# Patient Record
Sex: Female | Born: 1995 | Race: White | Hispanic: No | Smoking: Never smoker
Health system: Southern US, Community
[De-identification: ages and names within clinical notes are randomized; demographics above are authoritative.]

## PROBLEM LIST (undated history)

## (undated) DIAGNOSIS — Z789 Other specified health status: Secondary | ICD-10-CM

## (undated) HISTORY — PX: TONSILLECTOMY: SUR1361

---

## 2020-06-16 ENCOUNTER — Inpatient Hospital Stay (HOSPITAL_COMMUNITY): Payer: BC Managed Care – PPO

## 2020-06-16 ENCOUNTER — Encounter (HOSPITAL_COMMUNITY): Payer: Self-pay | Admitting: Obstetrics and Gynecology

## 2020-06-16 ENCOUNTER — Inpatient Hospital Stay (HOSPITAL_COMMUNITY)
Admission: AD | Admit: 2020-06-16 | Discharge: 2020-06-16 | Disposition: A | Discharge status: Home / Self Care | Payer: BC Managed Care – PPO | Attending: Obstetrics and Gynecology | Admitting: Obstetrics and Gynecology

## 2020-06-16 DIAGNOSIS — Z3A14 14 weeks gestation of pregnancy: Secondary | ICD-10-CM | POA: Diagnosis not present

## 2020-06-16 DIAGNOSIS — O208 Other hemorrhage in early pregnancy: Secondary | ICD-10-CM | POA: Insufficient documentation

## 2020-06-16 DIAGNOSIS — O468X2 Other antepartum hemorrhage, second trimester: Secondary | ICD-10-CM | POA: Diagnosis not present

## 2020-06-16 DIAGNOSIS — O418X20 Other specified disorders of amniotic fluid and membranes, second trimester, not applicable or unspecified: Secondary | ICD-10-CM

## 2020-06-16 DIAGNOSIS — O4690 Antepartum hemorrhage, unspecified, unspecified trimester: Secondary | ICD-10-CM

## 2020-06-16 HISTORY — DX: Other specified health status: Z78.9

## 2020-06-16 LAB — WET PREP, GENITAL
Clue Cells Wet Prep HPF POC: NONE SEEN
Sperm: NONE SEEN
Trich, Wet Prep: NONE SEEN
Yeast Wet Prep HPF POC: NONE SEEN

## 2020-06-16 LAB — URINALYSIS, ROUTINE W REFLEX MICROSCOPIC
Bilirubin Urine: NEGATIVE
Glucose, UA: NEGATIVE mg/dL
Ketones, ur: NEGATIVE mg/dL
Nitrite: NEGATIVE
Protein, ur: 30 mg/dL — AB
RBC / HPF: >50 RBC/hpf — ABNORMAL HIGH (ref 0–5)
Specific Gravity, Urine: 1.012 (ref 1.005–1.030)
pH: 5 (ref 5.0–8.0)

## 2020-06-16 LAB — POCT PREGNANCY, URINE: Preg Test, Ur: POSITIVE — AB

## 2020-06-16 NOTE — MAU Provider Note (Addendum)
History     CSN: [id number]  Arrival date and time: 06/16/20 2024   Event Date/Time   First Provider Initiated Contact with Patient 06/16/20 2100      Chief Complaint  Patient presents with  . Vaginal Bleeding   Cathy Kemp is a 25 y.o. G1P0 at [redacted]w[redacted]d by LMP who presents to MAU with complaints of vaginal bleeding. Patient reports that she has been seen in the office multiple times for light vaginal spotting and was diagnosed with [facility]. Patient reports that about an hour ago was sitting on the couch and started having heavy vaginal bleeding that was bright red. She reports having a gush of blood went to the bathroom and started passing large clots the size of a kiwi. Patient reports that in association with the vaginal bleeding she also started having some lower abdominal cramping, rates cramping 2/10.    OB History    Gravida  1   Para      Term      Preterm      AB      Living        SAB      IAB      Ectopic      Multiple      Live Births              Past Medical History:  Diagnosis Date  . Medical history non-contributory     Past Surgical History:  Procedure Laterality Date  . TONSILLECTOMY      Family History  Problem Relation Age of Onset  . Hypertension Mother   . Hyperthyroidism Maternal Aunt   . Hypothyroidism Maternal Grandmother     Social History   Tobacco Use  . Smoking status: Never Smoker  . Smokeless tobacco: Never Used  Vaping Use  . Vaping Use: Never used  Substance Use Topics  . Alcohol use: Never  . Drug use: Never    Allergies: No Known Allergies  Medications Prior to Admission  Medication Sig Dispense Refill Last Dose  . loratadine (CLARITIN) 10 MG tablet Take 10 mg by mouth daily.   06/16/2020 at Unknown time  . Prenatal Vit-Fe Fumarate-FA (PRENATAL MULTIVITAMIN) TABS tablet Take 1 tablet by mouth daily at 12 noon.   06/16/2020 at Unknown time    Review of Systems  Constitutional: Negative.   Respiratory:  Negative.   Cardiovascular: Negative.   Gastrointestinal: Positive for abdominal pain. Negative for constipation, diarrhea, nausea and vomiting.  Genitourinary: Positive for vaginal bleeding. Negative for difficulty urinating, dysuria, frequency, pelvic pain and urgency.  Musculoskeletal: Negative.   Neurological: Negative.   Psychiatric/Behavioral: Negative.    Physical Exam   Blood pressure 131/82, pulse (!) 104, temperature 98.7 F (37.1 C), resp. rate 16, height 5\' 3"  (1.6 m), weight 84.4 kg.  Physical Exam HENT:     Head: Normocephalic.  Cardiovascular:     Rate and Rhythm: Normal rate and regular rhythm.  Pulmonary:     Effort: Pulmonary effort is normal. No respiratory distress.     Breath sounds: Normal breath sounds. No wheezing.  Abdominal:     General: There is no distension.     Palpations: Abdomen is soft.     Tenderness: There is abdominal tenderness. There is no right CVA tenderness, left CVA tenderness, guarding or rebound.     Comments: LLQ tenderness- patient reports having a left ovarian cyst  Genitourinary:    Exam position: Lithotomy position.     Vagina: Bleeding  present.     Comments: Pelvic exam: Cervix pink, visually closed, without lesion, small amount of bright red mucous vaginal bleeding, no clots present, vaginal walls and external genitalia normal Musculoskeletal:     Right lower leg: No edema.     Left lower leg: No edema.  Skin:    General: Skin is warm and dry.  Neurological:     Mental Status: She is alert and oriented to person, place, and time.  Psychiatric:        Mood and Affect: Mood normal.        Behavior: Behavior normal.        Thought Content: Thought content normal.    FHR 165 by doppler   MAU Course  Procedures  MDM Orders Placed This Encounter  Procedures  . Wet prep, genital  . US OB LESS THAN 14 WEEKS WITH OB TRANSVAGINAL  . Urinalysis, Routine w reflex microscopic Urine, Clean Catch  . Pregnancy, urine POC    Labs and [place] report reviewed:  Results for orders placed or performed during the hospital encounter of 06/16/20 (from the past 24 hour(s))  Pregnancy, urine POC     Status: Abnormal   Collection Time: 06/16/20  8:42 PM  Result Value Ref Range   Preg Test, Ur POSITIVE (A) NEGATIVE  Urinalysis, Routine w reflex microscopic Urine, Clean Catch     Status: Abnormal   Collection Time: 06/16/20  8:43 PM  Result Value Ref Range   Color, Urine YELLOW YELLOW   APPearance HAZY (A) CLEAR   Specific Gravity, Urine 1.012 1.005 - 1.030   pH 5.0 5.0 - 8.0   Glucose, UA NEGATIVE NEGATIVE mg/dL   Hgb urine dipstick LARGE (A) NEGATIVE   Bilirubin Urine NEGATIVE NEGATIVE   Ketones, ur NEGATIVE NEGATIVE mg/dL   Protein, ur 30 (A) NEGATIVE mg/dL   Nitrite NEGATIVE NEGATIVE   Leukocytes,Ua LARGE (A) NEGATIVE   RBC / HPF >50 (H) 0 - 5 RBC/hpf   WBC, UA 21-50 0 - 5 WBC/hpf   Bacteria, UA FEW (A) NONE SEEN   Squamous Epithelial / LPF 0-5 0 - 5   Mucus PRESENT   Wet prep, genital     Status: Abnormal   Collection Time: 06/16/20  9:13 PM   Specimen: Vaginal  Result Value Ref Range   Yeast Wet Prep HPF POC NONE SEEN NONE SEEN   Trich, Wet Prep NONE SEEN NONE SEEN   Clue Cells Wet Prep HPF POC NONE SEEN NONE SEEN   WBC, Wet Prep HPF POC MANY (A) NONE SEEN   Sperm NONE SEEN    US showed continued [facility] measuring 2.4 x 1.1 x 0.7cm  Educated and discussed [facility] precautions and what to expect with vaginal bleeding. Pelvic rest and follow up as scheduled in the office for prenatal care.   Discussed reasons to return to MAU. Return to MAU as needed. Pt stable at time of discharge.   Assessment and Plan   1. Subchorionic hematoma in second trimester, single or unspecified fetus   2. Vaginal bleeding during pregnancy   3. [redacted] weeks gestation of pregnancy    Discharge home Follow up as scheduled in the office for prenatal care Return to MAU as needed for reasons discussed and/or emergencies  [facility] precautions  and bleeding precautions    Follow-up Information    [facility] Follow up.   Contact information: [address] [place] [address] [phone]  Allergies as of 06/16/2020   No Known Allergies     Medication List    TAKE these medications   loratadine 10 MG tablet Commonly known as: CLARITIN Take 10 mg by mouth daily.   prenatal multivitamin Tabs tablet Take 1 tablet by mouth daily at 12 noon.       Sharyon Cable CNM 06/16/2020, 10:07 PM

## 2020-06-16 NOTE — MAU Note (Addendum)
Have seen my ob few times due to some vaginal bleeding. Was seen Friday at office and everythink ok. Went to [employer] and had a lot of vag bleeding and clots. Some abdominal cramping since bleeding started. Does have subchorionic hemorrhage

## 2020-06-16 NOTE — Progress Notes (Signed)
Steward Drone CNM in earlier to discuss test results and discharge plan. Written and verbal d/c instructions given and understanding voiced.

## 2020-06-17 LAB — GC/CHLAMYDIA PROBE AMP (~~LOC~~) NOT AT ARMC
Chlamydia: NEGATIVE
Comment: NEGATIVE
Comment: NORMAL
Neisseria Gonorrhea: NEGATIVE

## 2020-06-22 ENCOUNTER — Inpatient Hospital Stay (HOSPITAL_BASED_OUTPATIENT_CLINIC_OR_DEPARTMENT_OTHER): Payer: BC Managed Care – PPO

## 2020-06-22 ENCOUNTER — Observation Stay (HOSPITAL_COMMUNITY)
Admission: AD | Admit: 2020-06-22 | Discharge: 2020-06-23 | Disposition: A | Discharge status: Home / Self Care | Payer: BC Managed Care – PPO | Attending: Obstetrics and Gynecology | Admitting: Obstetrics and Gynecology

## 2020-06-22 DIAGNOSIS — Z3A15 15 weeks gestation of pregnancy: Secondary | ICD-10-CM | POA: Diagnosis not present

## 2020-06-22 DIAGNOSIS — O4690 Antepartum hemorrhage, unspecified, unspecified trimester: Secondary | ICD-10-CM

## 2020-06-22 DIAGNOSIS — O200 Threatened abortion: Secondary | ICD-10-CM | POA: Diagnosis not present

## 2020-06-22 DIAGNOSIS — O468X2 Other antepartum hemorrhage, second trimester: Secondary | ICD-10-CM

## 2020-06-22 DIAGNOSIS — O26892 Other specified pregnancy related conditions, second trimester: Secondary | ICD-10-CM | POA: Diagnosis not present

## 2020-06-22 DIAGNOSIS — O209 Hemorrhage in early pregnancy, unspecified: Secondary | ICD-10-CM | POA: Diagnosis present

## 2020-06-22 DIAGNOSIS — O039 Complete or unspecified spontaneous abortion without complication: Secondary | ICD-10-CM | POA: Diagnosis not present

## 2020-06-22 LAB — COMPREHENSIVE METABOLIC PANEL
ALT: 42 U/L (ref 0–44)
AST: 23 U/L (ref 15–41)
Albumin: 3.4 g/dL — ABNORMAL LOW (ref 3.5–5.0)
Alkaline Phosphatase: 84 U/L (ref 38–126)
Anion gap: 13 (ref 5–15)
BUN: 5 mg/dL — ABNORMAL LOW (ref 6–20)
CO2: 20 mmol/L — ABNORMAL LOW (ref 22–32)
Calcium: 9 mg/dL (ref 8.9–10.3)
Chloride: 103 mmol/L (ref 98–111)
Creatinine, Ser: 0.57 mg/dL (ref 0.44–1.00)
GFR, Estimated: >60 mL/min (ref >60)
Glucose, Bld: 112 mg/dL — ABNORMAL HIGH (ref 70–99)
Potassium: 3.1 mmol/L — ABNORMAL LOW (ref 3.5–5.1)
Sodium: 136 mmol/L (ref 135–145)
Total Bilirubin: 0.4 mg/dL (ref 0.3–1.2)
Total Protein: 7 g/dL (ref 6.5–8.1)

## 2020-06-22 LAB — CBC WITH DIFFERENTIAL/PLATELET
Abs Immature Granulocytes: 0.1 10*3/uL — ABNORMAL HIGH (ref 0.00–0.07)
Basophils Absolute: 0 10*3/uL (ref 0.0–0.1)
Basophils Relative: 0 %
Eosinophils Absolute: 0.1 10*3/uL (ref 0.0–0.5)
Eosinophils Relative: 0 %
HCT: 34.2 % — ABNORMAL LOW (ref 36.0–46.0)
Hemoglobin: 11.2 g/dL — ABNORMAL LOW (ref 12.0–15.0)
Immature Granulocytes: 1 %
Lymphocytes Relative: 19 %
Lymphs Abs: 3.2 10*3/uL (ref 0.7–4.0)
MCH: 27.8 pg (ref 26.0–34.0)
MCHC: 32.7 g/dL (ref 30.0–36.0)
MCV: 84.9 fL (ref 80.0–100.0)
Monocytes Absolute: 1.1 10*3/uL — ABNORMAL HIGH (ref 0.1–1.0)
Monocytes Relative: 7 %
Neutro Abs: 12.8 10*3/uL — ABNORMAL HIGH (ref 1.7–7.7)
Neutrophils Relative %: 73 %
Platelets: 384 10*3/uL (ref 150–400)
RBC: 4.03 MIL/uL (ref 3.87–5.11)
RDW: 14 % (ref 11.5–15.5)
WBC: 17.4 10*3/uL — ABNORMAL HIGH (ref 4.0–10.5)
nRBC: 0 % (ref 0.0–0.2)

## 2020-06-22 LAB — TYPE AND SCREEN
ABO/RH(D): A POS
Antibody Screen: NEGATIVE

## 2020-06-22 MED ORDER — OXYTOCIN-SODIUM CHLORIDE 30-0.9 UT/500ML-% IV SOLN
INTRAVENOUS | Status: AC
  Filled 2020-06-22: qty 500

## 2020-06-22 MED ORDER — OXYTOCIN-SODIUM CHLORIDE 30-0.9 UT/500ML-% IV SOLN: 2.5000 [IU]/h | INTRAVENOUS | Status: DC

## 2020-06-22 MED ORDER — FENTANYL CITRATE (PF) 100 MCG/2ML IJ SOLN
100.0000 ug | Freq: Once | INTRAMUSCULAR | Status: AC
  Administered 2020-06-22 (×2): 50 ug via INTRAVENOUS
  Filled 2020-06-22: qty 2

## 2020-06-22 MED ORDER — PRENATAL MULTIVITAMIN CH: 1.0000 | ORAL_TABLET | Freq: Every day | ORAL | Status: DC

## 2020-06-22 MED ORDER — ZOLPIDEM TARTRATE 5 MG PO TABS: 5.0000 mg | ORAL_TABLET | Freq: Every evening | ORAL | Status: DC | PRN

## 2020-06-22 MED ORDER — ACETAMINOPHEN 325 MG PO TABS: 650.0000 mg | ORAL_TABLET | ORAL | Status: DC | PRN

## 2020-06-22 MED ORDER — LACTATED RINGERS IV SOLN: INTRAVENOUS | Status: DC

## 2020-06-22 MED ORDER — OXYTOCIN BOLUS FROM INFUSION
333.0000 mL | Freq: Once | INTRAVENOUS | Status: AC
  Administered 2020-06-22: 333 mL via INTRAVENOUS

## 2020-06-22 MED ORDER — DOCUSATE SODIUM 100 MG PO CAPS: 100.0000 mg | ORAL_CAPSULE | Freq: Every day | ORAL | Status: DC

## 2020-06-22 MED ORDER — LORAZEPAM 2 MG/ML IJ SOLN
2.0000 mg | Freq: Once | INTRAMUSCULAR | Status: AC
  Administered 2020-06-22: 2 mg via INTRAVENOUS
  Filled 2020-06-22: qty 1

## 2020-06-22 MED ORDER — CALCIUM CARBONATE ANTACID 500 MG PO CHEW: 2.0000 | CHEWABLE_TABLET | ORAL | Status: DC | PRN

## 2020-06-22 MED ORDER — KETOROLAC TROMETHAMINE 30 MG/ML IJ SOLN
30.0000 mg | Freq: Once | INTRAMUSCULAR | Status: AC
  Administered 2020-06-22: 30 mg via INTRAVENOUS
  Filled 2020-06-22: qty 1

## 2020-06-22 NOTE — MAU Note (Signed)
Pt reports 1 hour ago she started having uterine cramping and vaginal bleeding with numerous clots. Pt reports she is 15 weeks and G1P0. Bright red bleeding noted on peri pad when pt assisting out of wheelchair

## 2020-06-22 NOTE — MAU Note (Signed)
Joni Reining NP and ultrasound tech at bedside

## 2020-06-22 NOTE — H&P (Signed)
Philamena Kramar is a 25 y.o. female G1P0 [redacted]w[redacted]d presenting for VB and cramping. Patient states that she started having some more intense cramping this evening that started suddenly, coming and going similar to contractions. Since arriving to MAU bleeding became much heavier and has passed several large clots.   Pregnancy dated by 12w sono not c/w LMP. Patient has received PNC at [facility] since that time. Pregnancy significant for recurrent first/second trimester VB and presence of SCH. On initial CP sono on 1/3 there was some debris noted posterior to the amnion but no definitive evidence of [facility]. On subsequent [place] performed in the office on 1/10 done for VB, a [facility] was noted measuring 1.4 x 0.5 x 1.4 cm. On that [place] an incidental shortened cervix measuring 2 cm was also noted. Patient was counseled on unclear clinical significance of CL at [redacted] weeks GA. Counseled on continued close outpatient observation with plan to repeat CL at 16w and potential for vaginal progesterone therapy at that time if CL measured < 2.5 cm. She had OB1 labs done that were WNL. Panorama genetic testing still pending at this time. Patient had been taking a daily PNV and PRN Zofran for nausea. No other significant PMH.   OB History    Gravida  1   Para      Term      Preterm      AB      Living        SAB      IAB      Ectopic      Multiple      Live Births             Past Medical History:  Diagnosis Date  . Medical history non-contributory    Past Surgical History:  Procedure Laterality Date  . TONSILLECTOMY     Family History: family history includes Hypertension in her mother; Hyperthyroidism in her maternal aunt; Hypothyroidism in her maternal grandmother. Social History:  reports that she has never smoked. She has never used smokeless tobacco. She reports that she does not drink alcohol and does not use drugs.   Review of Systems  All other systems reviewed and are negative.  Per HPI Exam Physical  Exam  Dilation: 10 Exam by:: Dr. Conni Elliot Blood pressure (!) 120/59, pulse (!) 123, temperature 98.5 F (36.9 C), temperature source Oral, resp. rate (!) 24.  Formal OB US performed, images reviewed, report pending. Evidence of SIUP with +FHR and [facility] measuring > 4cm  Patient examined at bedside found to be fully dilated with fetal parts in the vagina  Prenatal labs: ABO, Rh:  --/--/A POS (01/22 1950) Antibody: NEG (01/22 1950) Rubella:  Immune RPR:   NR HBsAg:   NEG HIV:   NR GBS:   unknown Panorama pending  Assessment/Plan: 24Y G1P0 @ 15.1 with inevitable abortion with fully dilated cervix and fetal parts in the vagina  -Patient counseled on exam findings and emotional support was provided.  -IV pain control with Fentanyl and Ativan provided -Patient delivered intact fetus within the amniotic sac and attached placenta. No evidence of life at time of delivery.  -VB minimal, IV Pitocin ran ppx. Blood tyoe A POS  -Mild leukocytosis noted on admission labs with WBC of 17, however patient has remained afebrile. Tachycardia likely related to emotional state. Will cont to monitor over the next few hours -Discussed possible discharge home later this evening versus in the morning pending patient's clinical status. Will reassess in  1-2 hours  Kymia Simi A Kelten Enochs 06/22/2020, 10:26 PM

## 2020-06-22 NOTE — MAU Provider Note (Signed)
History     CSN: [id number]  Arrival date and time: 06/22/20 1935   Event Date/Time   First Provider Initiated Contact with Patient 06/22/20 1938      Chief Complaint  Patient presents with  . Vaginal Bleeding    Numerous clots  . Abdominal Pain   Ms. Cathy Kemp is a 25 y.o. G1P0 at [redacted]w[redacted]d who presents to MAU for vaginal bleeding which began shortly before coming to MAU. Patient reports she has a known subchorionic hemorrhage. Patient reports shortly before she arrived at MAU she began experiencing intense waves of pelvic/lower abdominal pain and vaginal bleeding. When patient arrived in MAU she had her pants soaked with blood and a large clot present on her pad. Patient was also rating intermittent pelvic/abdominal pain as 10/10.  Passing blood clots? yes Blood soaking clothes? yes Lightheaded/dizzy? no Significant pelvic pain or cramping/ctx? yes Passed any tissue? no  Allergies? NKDA Current medications? PNV, Claritin Current PNC & next appt? [facility]   OB History    Gravida  1   Para      Term      Preterm      AB      Living        SAB      IAB      Ectopic      Multiple      Live Births              Past Medical History:  Diagnosis Date  . Medical history non-contributory     Past Surgical History:  Procedure Laterality Date  . TONSILLECTOMY      Family History  Problem Relation Age of Onset  . Hypertension Mother   . Hyperthyroidism Maternal Aunt   . Hypothyroidism Maternal Grandmother     Social History   Tobacco Use  . Smoking status: Never Smoker  . Smokeless tobacco: Never Used  Vaping Use  . Vaping Use: Never used  Substance Use Topics  . Alcohol use: Never  . Drug use: Never    Allergies: No Known Allergies  Medications Prior to Admission  Medication Sig Dispense Refill Last Dose  . loratadine (CLARITIN) 10 MG tablet Take 10 mg by mouth daily.     . Prenatal Vit-Fe Fumarate-FA (PRENATAL MULTIVITAMIN)  TABS tablet Take 1 tablet by mouth daily at 12 noon.       Review of Systems  Constitutional: Negative for chills, diaphoresis, fatigue and fever.  Eyes: Negative for visual disturbance.  Respiratory: Negative for shortness of breath.   Cardiovascular: Negative for chest pain.  Gastrointestinal: Positive for abdominal pain. Negative for constipation, diarrhea, nausea and vomiting.  Genitourinary: Positive for pelvic pain and vaginal bleeding. Negative for dysuria, flank pain, frequency, urgency and vaginal discharge.  Neurological: Negative for dizziness, weakness, light-headedness and headaches.   Physical Exam   Blood pressure (!) 136/57, pulse (!) 112, temperature 98.5 F (36.9 C), temperature source Oral, resp. rate (!) 24.  Physical Exam Vitals and nursing note reviewed. Exam conducted with a chaperone present.  Constitutional:      General: She is not in acute distress.    Appearance: Normal appearance. She is not ill-appearing, toxic-appearing or diaphoretic.  HENT:     Head: Normocephalic and atraumatic.  Pulmonary:     Effort: Pulmonary effort is normal.  Abdominal:     General: There is no distension.     Palpations: Abdomen is soft. There is no mass.     Tenderness:  There is abdominal tenderness. There is no guarding.  Genitourinary:    General: Normal vulva.     Labia:        Right: No rash, tenderness or lesion.        Left: No rash, tenderness or lesion.      Vagina: Bleeding present.     Comments: Moderate blood and small clots pooling in vagina, cervical os unable to be fully visualized as blood pooling too quickly to be cleared away. US technician reports cervix may be covering os, no digital exam performed. Skin:    General: Skin is warm and dry.  Neurological:     Mental Status: She is alert and oriented to person, place, and time.  Psychiatric:        Mood and Affect: Mood normal.        Behavior: Behavior normal.        Thought Content: Thought content  normal.        Judgment: Judgment normal.    Results for orders placed or performed during the hospital encounter of 06/22/20 (from the past 24 hour(s))  Type and screen     Status: None (Preliminary result)   Collection Time: 06/22/20  7:50 PM  Result Value Ref Range   ABO/RH(D) PENDING    Antibody Screen PENDING    Sample Expiration      06/25/2020,2359 Performed at [facility], [address] [place], [place] [address]    US MFM OB LIMITED  Result Date: 06/17/2020 ----------------------------------------------------------------------  OBSTETRICS REPORT                        (Signed Final 06/17/2020 11:56 am) ---------------------------------------------------------------------- Patient Info  ID #:       [id number]                          D.O.B.:  1995/06/13 (24 yrs)  Name:       Cathy Kemp                  Visit Date: 06/16/2020 09:35 pm ---------------------------------------------------------------------- Performed By  Attending:        Ma RingsVictor Fang MD         Ref. Address:      Faculty  Performed By:     Earley BrookeNicole S Dalrymple     Secondary Phy.:    [facility]                    BS, RDMS  Referred By:      Rudean CurtVERONICA C             [facility] ---------------------------------------------------------------------- Orders  #  Description                           Code        Ordered By  1  US MFM OB LIMITED                     [id number]  Steward DroneVERONICA ROGERS ----------------------------------------------------------------------  #  Order #                     Accession #                Episode #  1  [id number]                   [id number]                 [id number] ---------------------------------------------------------------------- Indications  Subchorionic hemorrhage, antepartum             O45.90  [redacted] weeks gestation of pregnancy                 Z3A.14  Vaginal bleeding in pregnancy, second            O46.92  trimester ---------------------------------------------------------------------- Fetal Evaluation  Num Of Fetuses:          1  Fetal Heart Rate(bpm):   170  Cardiac Activity:        Observed  Presentation:            Transverse, head to maternal left  Placenta:                Anterior  P. Cord Insertion:       Not well visualized  Amniotic Fluid  AFI FV:      Subjectively low-normal                              Largest Pocket(cm)                              3.1  Comment:    Subchorionic hemorrhage noted 2.4 x 1.1 x 0.7cm. ---------------------------------------------------------------------- Biometry  CRL:      73.3  mm     G. Age:  13w 1d                  EDD:    12/21/20 ---------------------------------------------------------------------- OB History  Gravidity:    1         Term:   0        Prem:   0        SAB:   0  TOP:          0       Ectopic:  0        Living: 0 ---------------------------------------------------------------------- Gestational Age  Best:          14w 2d     Det. By:  Marcella DubsEarly Ultrasound         EDD:   12/13/20 ---------------------------------------------------------------------- Cervix Uterus Adnexa  Cervix  Closed  Right Ovary  Within normal limits.  Left Ovary  Within normal limits. Small corpus luteum noted.  Adnexa  No abnormality visualized. ---------------------------------------------------------------------- Comments  This patient preseneted to the MAU due to vaginal bleeding .  A 2.1 x 1.1 cm subchorionic hematoma was noted on today's  exam  There was low normal amniotic fluid noted. ----------------------------------------------------------------------                   Ma RingsVictor Fang, MD Electronically Signed Final Report   06/17/2020 11:56 am ----------------------------------------------------------------------   MAU Course  Procedures  MDM -VB and contractions -large clot on admission, moderate bleeding with small clots present on exam -US: 0.5cm cervix, FHR  170s -labs ordered -Fentanyl given for pain -LR bolus started -called and spoke with Dr. Conni Elliot to recommend admission and discussed presentation and physical exam findings and medication administration, Dr. Conni Elliot agrees with plan for admission -admit to [facility]  Orders Placed This Encounter  Procedures  . [place] MFM OB LIMITED    Standing Status:   Standing    Number of Occurrences:   1    Order Specific Question:   Symptom/Reason for Exam    Answer:   Vaginal bleeding during pregnancy [9604540]    Order Specific Question:   Symptom/Reason for Exam    Answer:   [redacted] weeks gestation of pregnancy [720456]  . CBC with Differential/Platelet    Standing Status:   Standing    Number of Occurrences:   1  . Comprehensive metabolic panel    Standing Status:   Standing    Number of Occurrences:   1  . RPR    Standing Status:   Standing    Number of Occurrences:   1  . Notify physician (specify)    Standing Status:   Standing    Number of Occurrences:   20    Order Specific Question:   Notify Physician    Answer:   for pulse less than 60 or greater than 120    Order Specific Question:   Notify Physician    Answer:   for respiratory rate less than 12 or greater than 28    Order Specific Question:   Notify Physician    Answer:   for temperature greater than 100.4    Order Specific Question:   Notify Physician    Answer:   for urinary output less than 30 ml/hr    Order Specific Question:   Notify Physician    Answer:   for systolic BP less than 80 or greater than 140    Order Specific Question:   Notify Physician    Answer:   for diastolic BP less than 40 or greater than 90  . Vital signs    While awake, respect sleep.    Standing Status:   Standing    Number of Occurrences:   1  . Defer vaginal exam for vaginal bleeding or PROM <37 weeks    Standing Status:   Standing    Number of Occurrences:   1  . Initiate Oral Care Protocol    Standing Status:   Standing    Number of  Occurrences:   1  . Initiate Carrier Fluid Protocol    Standing Status:   Standing    Number of Occurrences:   1  . Full code    Standing Status:   Standing    Number of Occurrences:   1  . Type and screen    Standing Status:   Standing    Number of Occurrences:   1  . Place in observation (patient's expected length of stay will be less than 2 midnights)    Standing Status:   Standing    Number of Occurrences:   1    Order Specific Question:   Hospital Area    Answer:   [facility] [[id number]]    Order Specific Question:   Level of Care    Answer:   Antepartum [20]    Order Specific Question:   Covid Evaluation    Answer:   Asymptomatic Screening Protocol (No Symptoms)    Order Specific Question:   Diagnosis  Answer:   Vaginal bleeding before [redacted] weeks gestation [037944]    Order Specific Question:   Admitting Physician    Answer:   Toy Baker [[id number]]    Order Specific Question:   Attending Physician    Answer:   Toy Baker [[id number]]   Meds ordered this encounter  Medications  . fentaNYL (SUBLIMAZE) injection 100 mcg  . acetaminophen (TYLENOL) tablet 650 mg  . zolpidem (AMBIEN) tablet 5 mg  . docusate sodium (COLACE) capsule 100 mg  . calcium carbonate (TUMS - dosed in mg elemental calcium) chewable tablet 400 mg of elemental calcium  . DISCONTD: prenatal multivitamin tablet 1 tablet    Assessment and Plan   1. Threatened miscarriage   2. [redacted] weeks gestation of pregnancy   3. Vaginal bleeding during pregnancy    -admit to [facility]  Odie Sera Marthann Abshier 06/22/2020, 8:34 PM

## 2020-06-23 ENCOUNTER — Encounter (HOSPITAL_COMMUNITY): Payer: Self-pay

## 2020-06-23 LAB — RPR: RPR Ser Ql: NONREACTIVE

## 2020-06-23 MED ORDER — IBUPROFEN 600 MG PO TABS: 600.0000 mg | ORAL_TABLET | Freq: Four times a day (QID) | ORAL | 0 refills | Status: AC | PRN

## 2020-06-23 NOTE — MAU Note (Signed)
Pt discharged in stable condition with significant other after confirming understanding of discharge education.  Pt ambulated off the unit with significant other.  Pt informed she may return at any time if concerns arise.

## 2020-06-23 NOTE — Discharge Instructions (Signed)
Miscarriage A miscarriage is the loss of a pregnancy before the 20th week of pregnancy. Sometimes, a pregnancy ends before a woman knows that she is pregnant. If you lose a pregnancy, talk with your doctor about:  Questions you have about the loss of your baby.  How to work through your grief.  Plans for future pregnancy. What are the causes? Many times, the cause of this condition is not known. What increases the risk? These things may make a pregnant woman more likely to lose a pregnancy: Certain health conditions  Conditions that affect hormones, such as: ? Thyroid disease. ? Polycystic ovary syndrome.  Diabetes.  A disease that causes the body's disease-fighting system to attack itself by mistake.  Infections.  Bleeding problems.  Being very overweight. Lifestyle factors  Using products that have tobacco or nicotine in them.  Being around tobacco smoke.  Having alcohol.  Having a lot of caffeine.  Using drugs. Problems with reproductive organs or parts  Having a cervix that opens and thins before your due date. The cervix is the lowest part of your womb.  Having Asherman syndrome, which leads to: ? Scars in the womb. ? The womb being abnormal in shape.  Growths (fibroids) in the womb.  Problems in the body that are present at birth.  Infection of the cervix or womb. Personal or health history  Injury.  Having lost a pregnancy before.  Being younger than age 18 or older than age 35.  Being around a harmful substance, such as radiation.  Having lead or other heavy metals in: ? Things you eat or drink. ? The air around you.  Using certain medicines. What are the signs or symptoms?  Blood or spots of blood coming from the vagina. You may also have cramps or pain.  Pain or cramps in the belly or low back.  Fluid or tissue coming out of the vagina. How is this treated? Sometimes, treatment is not needed. If you need treatment, you may be  treated with:  A procedure to open the cervix more and take tissue out of the womb.  Medicines. You may get a shot of medicine called Rho(D) immune globulin. Follow these instructions at home: Medicines  Take over-the-counter and prescription medicines only as told by your doctor.  If you were prescribed antibiotic medicine, take it as told by your doctor. Do not stop taking it even if you start to feel better. Activity  Rest as told by your doctor. Ask your doctor what activities are safe for you.  Have someone help you at home during this time. General instructions  Watch how much tissue comes out of the vagina.  Watch the size of any blood clots that come out of the vagina.  Do not have sex or douche until your doctor says it is okay.  Do not put things, such as tampons, in your vagina until your doctor says it is okay.  To help you and your partner with grieving: ? Talk with your doctor. ? See a counselor.  When you are ready, talk with your doctor about: ? Things to do for your health. ? How you can be healthy if you get pregnant again.  Keep all follow-up visits.   Where to find more information  The American College of Obstetricians and Gynecologists: acog.org  [place] Department of Health and Human Services Office of Women's Health: hrsa.gov/office-womens-health Contact a doctor if:  You have a fever or chills.  There is bad-smelling fluid coming   from the vagina.  You have more bleeding.  Tissue or clots of blood come out of your vagina. Get help right away if:  You have very bad cramps or pain in your back or belly.  You soak more than 2 large pads in an hour for more than 2 hours.  You get light-headed or weak.  You faint.  You feel sad, and you have sad thoughts a lot of the time.  You think about hurting yourself. Get help right awayif you feel like you may hurt yourself or others, or have thoughts about taking your own life. Go to your nearest  emergency room or:  Call your local emergency services (911 in the [place]  Call the National Suicide Prevention Lifeline at 1-800-273-8255. This is open 24 hours a day.  Text the Crisis Text Line at 741741. Summary  A miscarriage is the loss of a pregnancy before the 20th week of pregnancy. Sometimes, a pregnancy ends before a woman knows that she is pregnant.  Follow instructions from your doctor about medicines and activity.  To help you and your partner with grieving, talk with your doctor or a counselor.  Keep all follow-up visits. This information is not intended to replace advice given to you by your health care provider. Make sure you discuss any questions you have with your health care provider. Document Revised: 11/17/2019 Document Reviewed: 11/17/2019 Elsevier Patient Education  2021 [employer]  

## 2020-06-23 NOTE — Discharge Summary (Signed)
Physician Discharge Summary  Patient ID: Cathy Kemp MRN: [id number] DOB/AGE: Aug 11, 1995 25 y.o.  Admit date: 06/22/2020 Discharge date: 06/23/2020  Admission Diagnoses: Vaginal bleeding before [redacted] weeks gestation Abortion, inevitable   Discharge Diagnoses:  Principal Problem:   Abortion, spontaneous complete Active Problems:   Vaginal bleeding before [redacted] weeks gestation   Discharged Condition: good  Hospital Course:   06/23/19: 24Y G1P0 @ 15.1, Patient presented to MAU with significant pain and bleeding. On examination patient found to be fully dilated with parts present in the vagina. Patient delivered at approximately 2230, intact membranes and placenta delivered simultaneously. Patient bleeding was minimal, received IV Pitocin prophylaxis. Vitals remained stable with exception of mild tachycardia likely related to emotional stress.   06/23/20: Patient monitored for 2 hours in MAU. Minimal cramping pain and minimal bleeding. Patient strongly desires discharge home at this time. Counseled on signs and symptoms of infection and knows to call the office with any fevers, chills, heavy bleeding, abnormal discharge, or worsening pain. Discharged home  Consults: none  Significant Diagnostic Studies: US showed +FHR and large [facility]   Treatments: IV pain control  Discharge Exam: Blood pressure 135/83, pulse (!) 123, temperature 98.4 F (36.9 C), temperature source Oral, resp. rate (!) 24. General appearance: alert and no distress Lochia: minimal Abdomen: soft no uterine tenderness Disposition: Discharge disposition: 01-Home or Self Care       Discharge Instructions    Activity as tolerated   Complete by: As directed    Call MD for:  difficulty breathing, headache or visual disturbances   Complete by: As directed    Call MD for:  persistant dizziness or light-headedness   Complete by: As directed    Call MD for:  persistant nausea and vomiting   Complete by: As directed     Call MD for:  redness, tenderness, or signs of infection (pain, swelling, redness, odor or green/yellow discharge around incision site)   Complete by: As directed    Call MD for:  severe uncontrolled pain   Complete by: As directed    Call MD for:  temperature >100.4   Complete by: As directed    Diet general   Complete by: As directed    Discharge instructions   Complete by: As directed    WOB instructions booklet     Allergies as of 06/23/2020   No Known Allergies     Medication List    STOP taking these medications   prenatal multivitamin Tabs tablet     TAKE these medications   ibuprofen 600 MG tablet Commonly known as: ADVIL Take 1 tablet (600 mg total) by mouth every 6 (six) hours as needed for cramping.   loratadine 10 MG tablet Commonly known as: CLARITIN Take 10 mg by mouth daily.        Signed: Andric Kerce A Debbera Wolken 06/23/2020, 12:09 AM

## 2020-06-23 NOTE — MAU Note (Signed)
Pt delivered products of conception with MD at the bedside.  No signs of viable life upon delivery, RN will continue to monitor pt.

## 2020-06-25 LAB — SURGICAL PATHOLOGY

## 2021-08-05 IMAGING — US US MFM OB LIMITED
1 series · 15 of 28 positions shown · non-contrast
Comparison: none

[Series 1: us mfm ob limited · 15 of 29 slices shown]
[im 1/29]
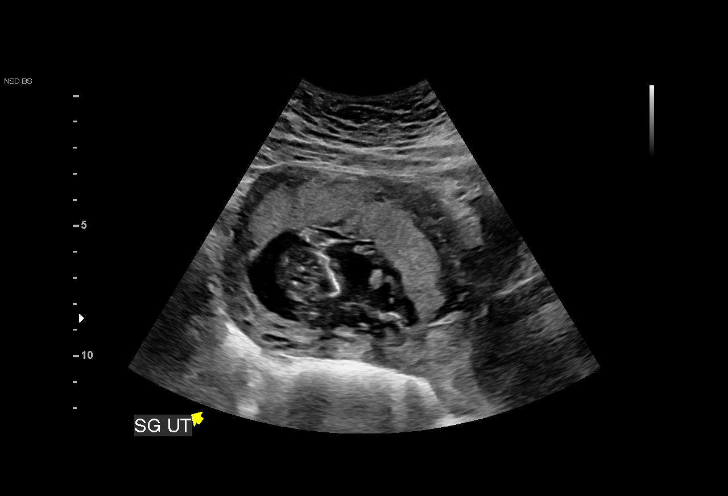
[im 3/29]
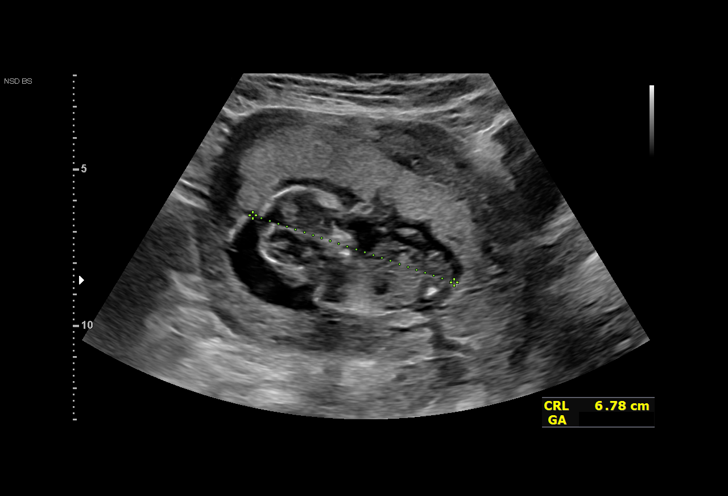
[im 5/29]
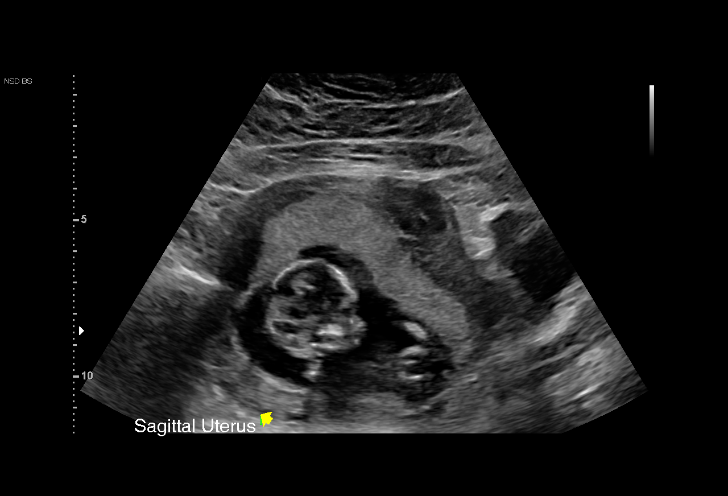
[im 7/29]
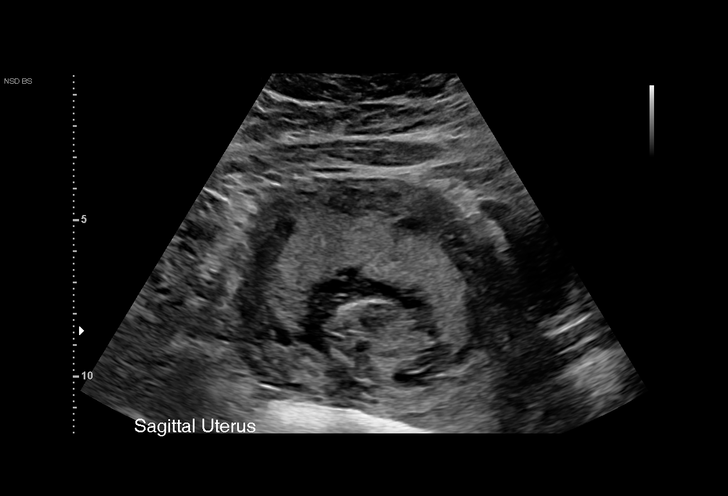
[im 9/29]
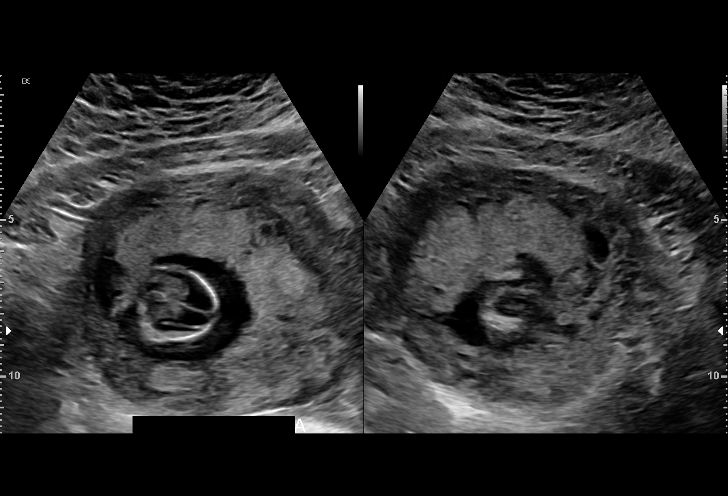
[im 11/29]
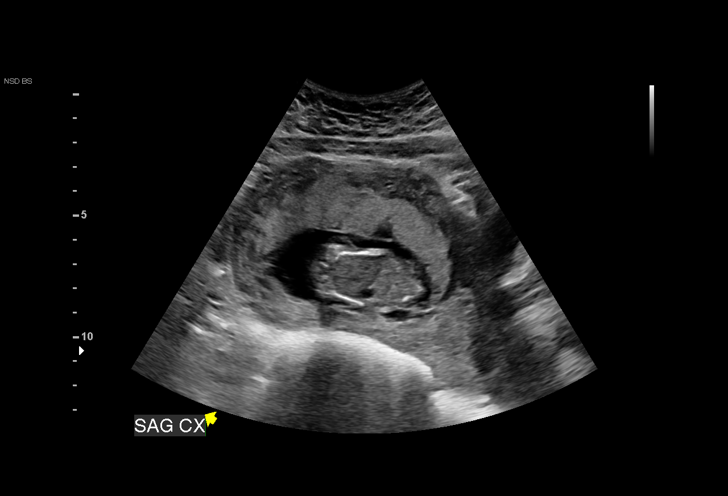
[im 13/29]
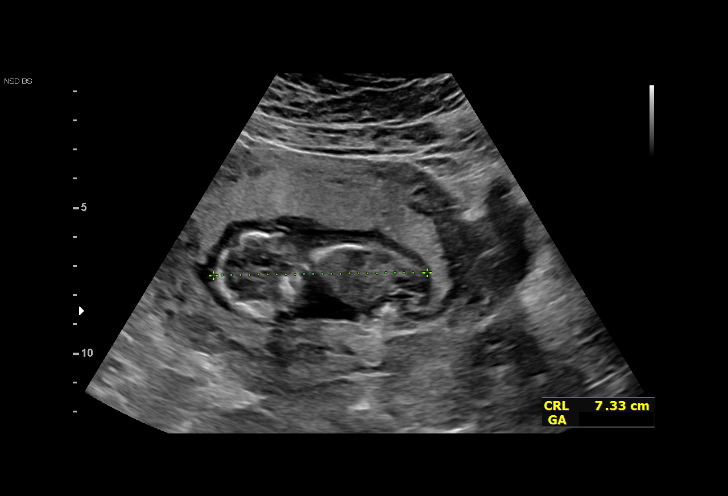
[im 15/29]
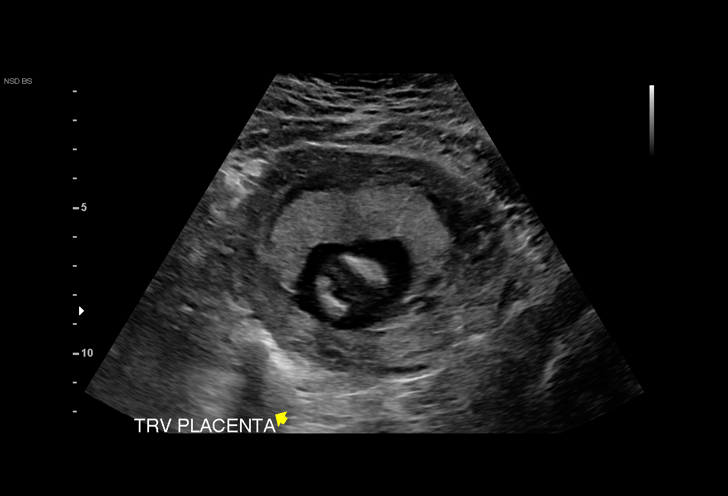
[im 16/29]
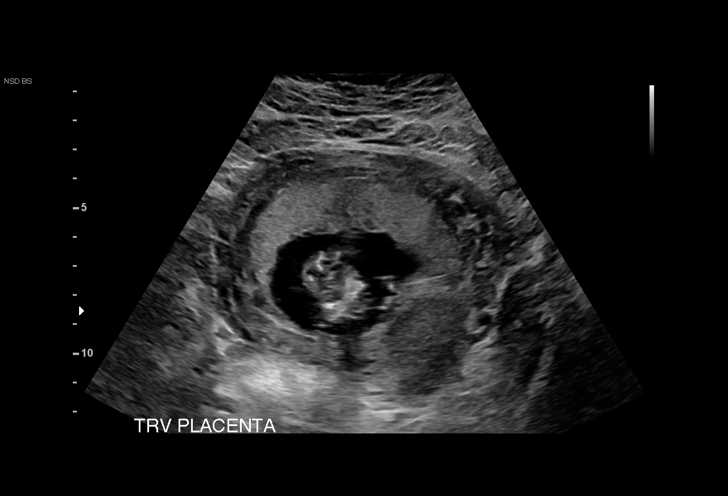
[im 18/29]
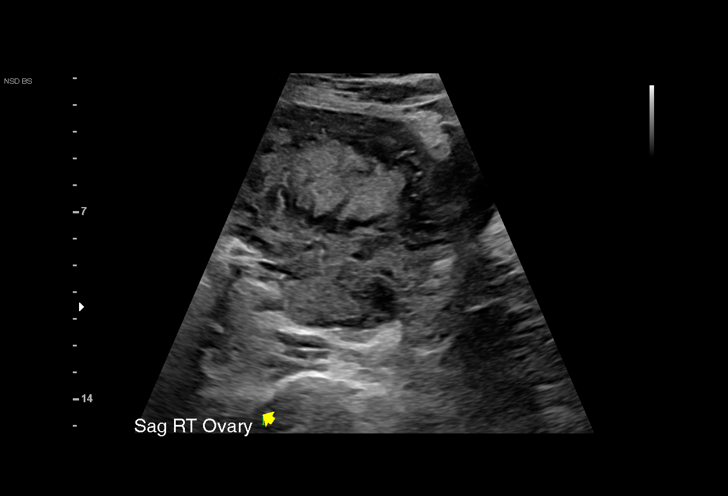
[im 20/29]
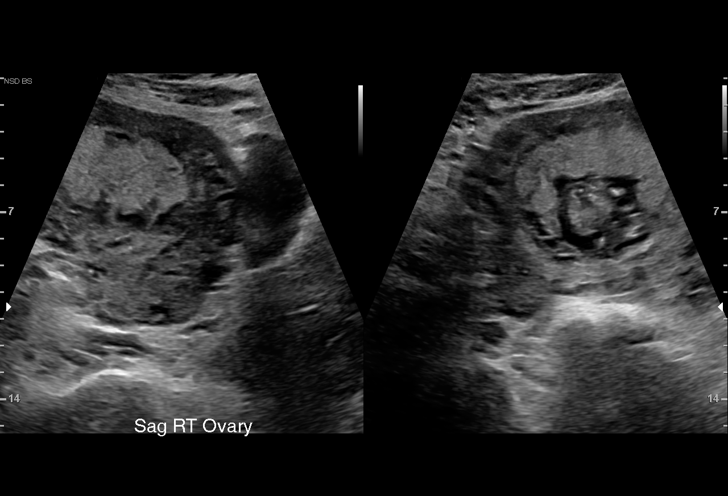
[im 22/29]
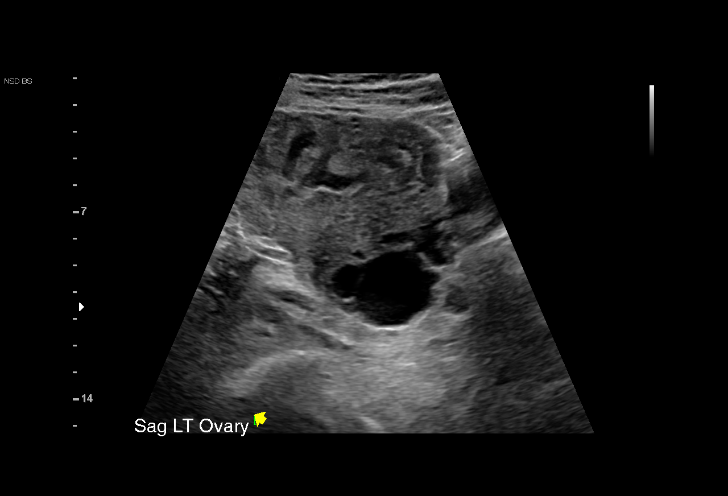
[im 24/29]
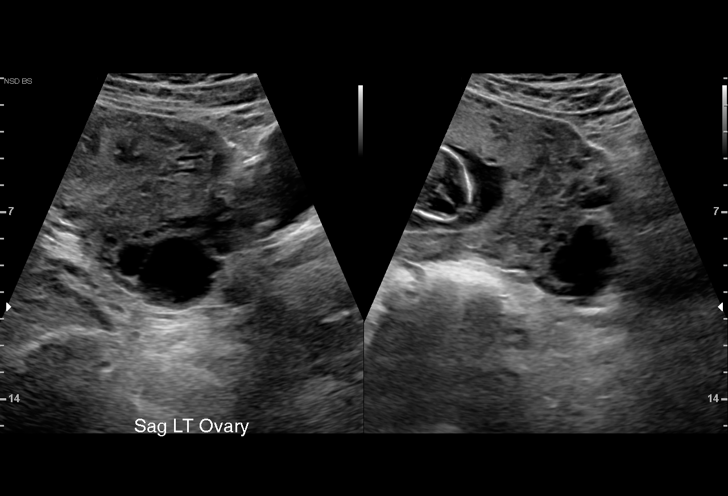
[im 26/29]
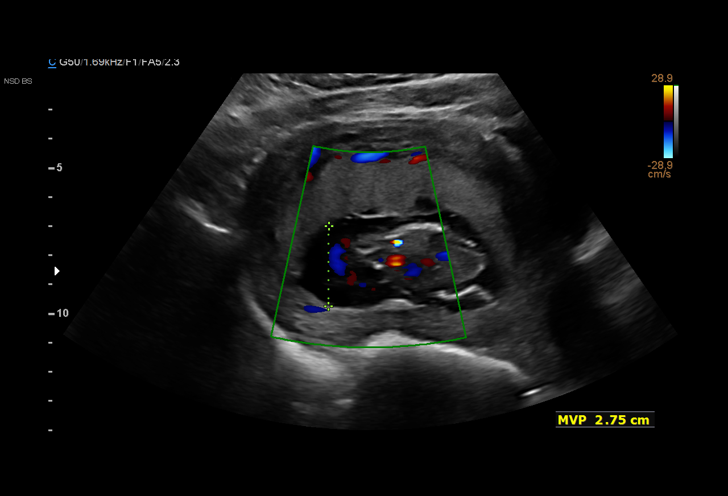
[im 29/29]
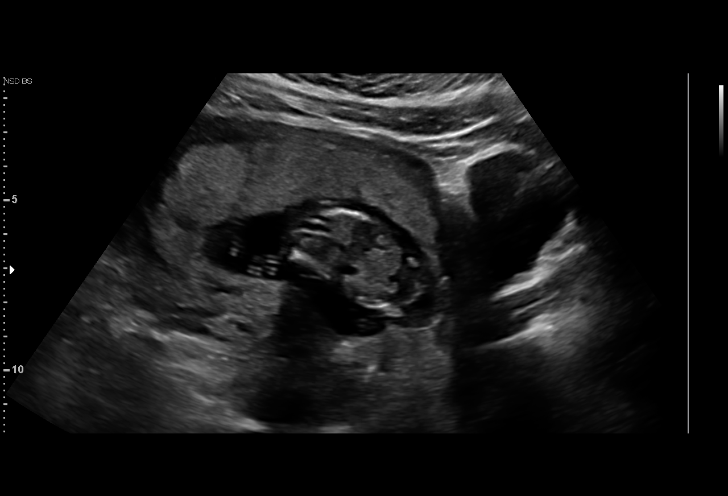

[15 of 28 positions shown; findings below may reference images not displayed]

[facility]                                [HOSPITAL]

 1  US MFM OB LIMITED                     76815.01    GURINDER ARMAS

Indications

 Subchorionic hemorrhage, antepartum
 14 weeks gestation of pregnancy
 Vaginal bleeding in pregnancy, second
 trimester
Fetal Evaluation

 Num Of Fetuses:          1
 Fetal Heart Rate(bpm):   170
 Cardiac Activity:        Observed
 Presentation:            Transverse, head to maternal left
 Placenta:                Anterior
 P. Cord Insertion:       Not well visualized

 Amniotic Fluid
 AFI FV:      Subjectively low-normal

                             Largest Pocket(cm)


 Comment:    Subchorionic hemorrhage noted 2.4 x 1.1 x 0.7cm.
Biometry

 CRL:      73.3  mm     G. Age:  13w 1d                  EDD:    12/21/20
OB History

 Gravidity:    1         Term:   0        Prem:   0        SAB:   0
 TOP:          0       Ectopic:  0        Living: 0
Gestational Age
 Best:          14w 2d     Det. By:  Early Ultrasound         EDD:   12/13/20
Cervix Uterus Adnexa

 Cervix
 Closed

 Right Ovary
 Within normal limits.

 Left Ovary
 Within normal limits. Small corpus luteum noted.

 Adnexa
 No abnormality visualized.
Comments

 This patient preseneted to the [facility] due to vaginal bleeding .
 A 2.1 x 1.1 cm subchorionic hematoma was noted on today's
 exam
 There was low normal amniotic fluid noted.

## 2021-08-11 IMAGING — US US MFM OB LIMITED
1 series · 14 of 14 positions shown · non-contrast
Comparison: none

[Series 1: us mfm ob limited · 14 acquisitions, 14 frames shown]
[im 1/14]
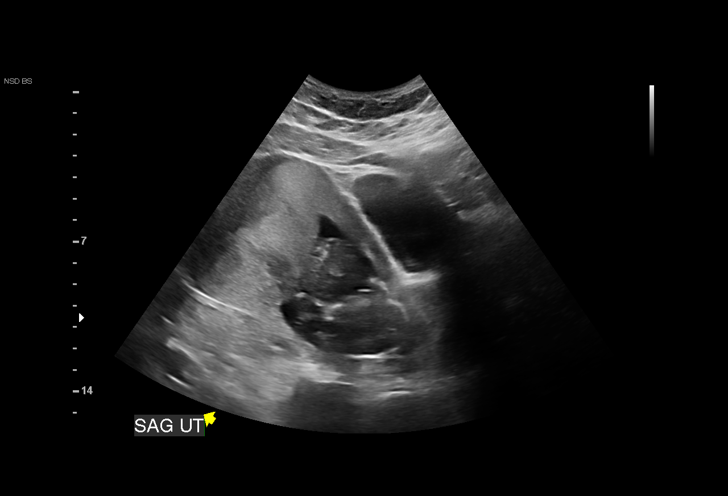
[im 2/14]
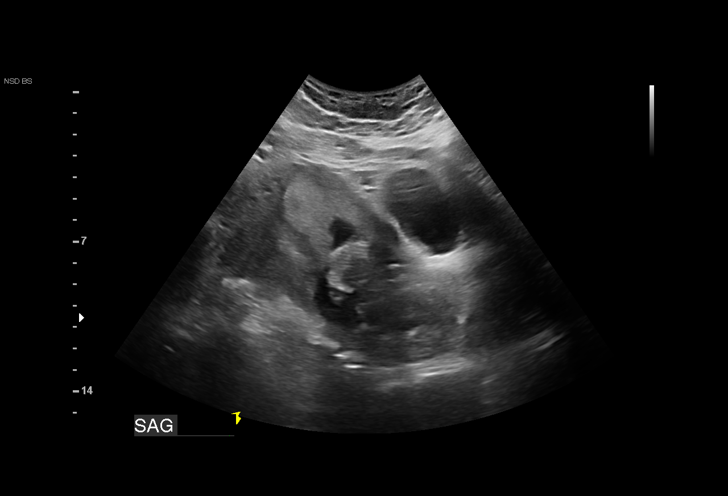
[im 3/14]
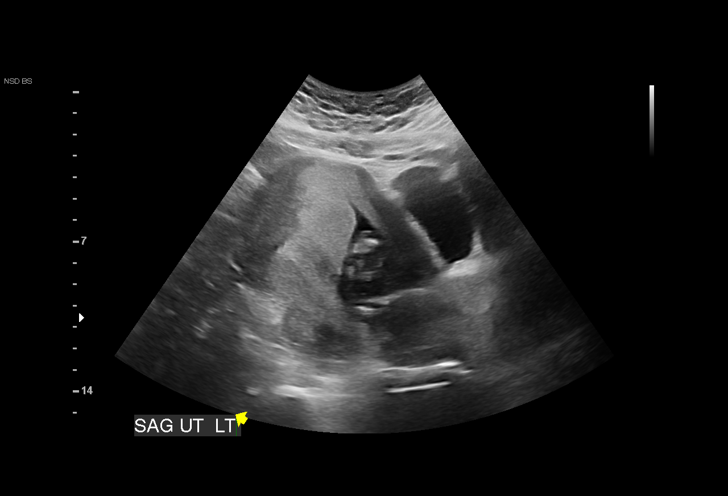
[im 4/14]
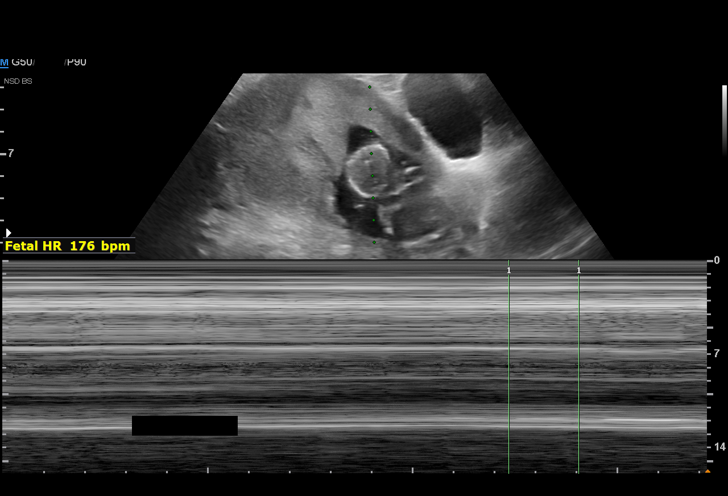
[im 5/14]
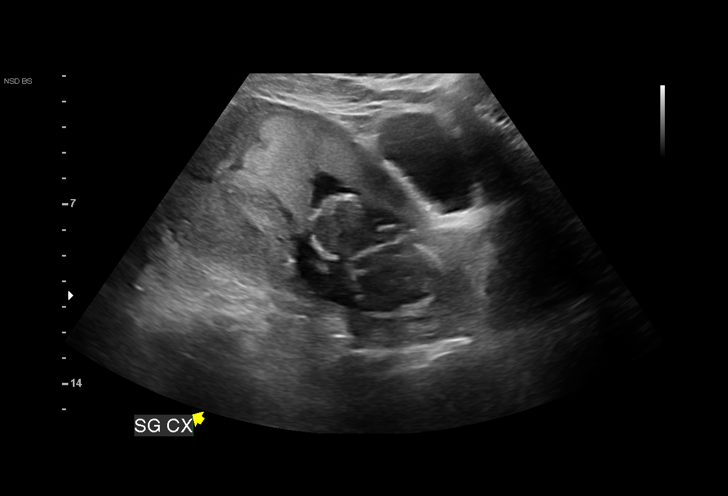
[im 6/14]
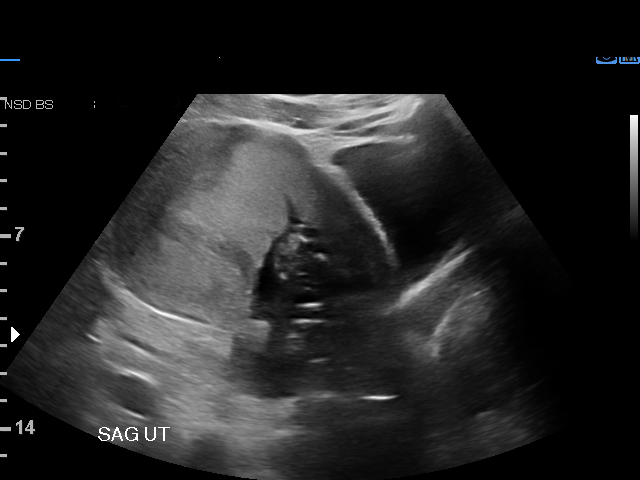
[im 7/14]
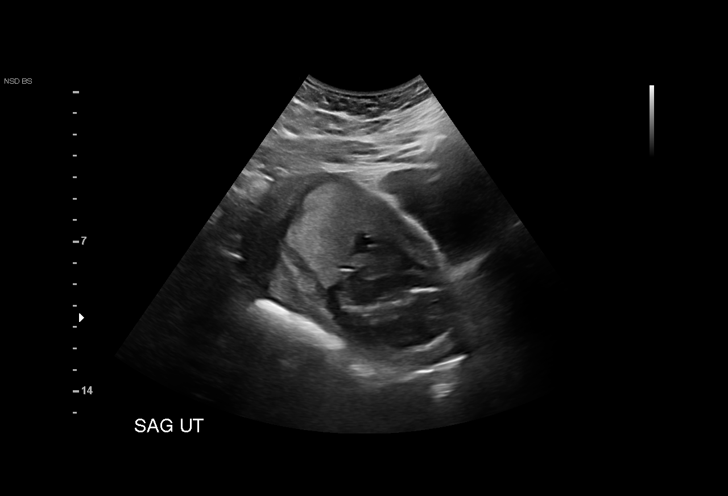
[im 8/14]
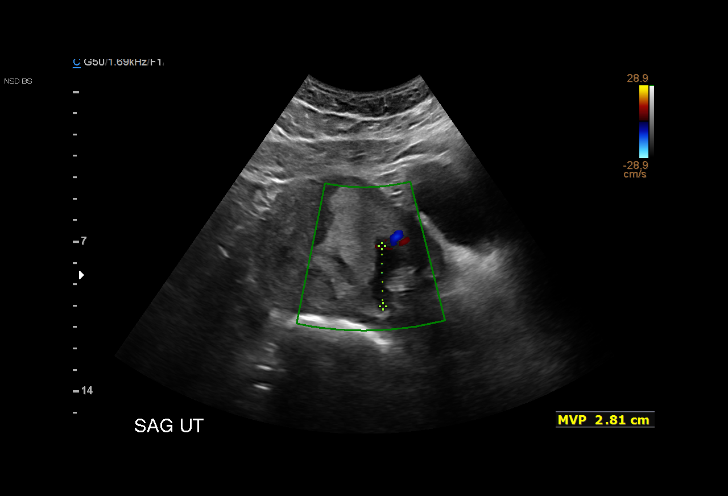
[im 9/14]
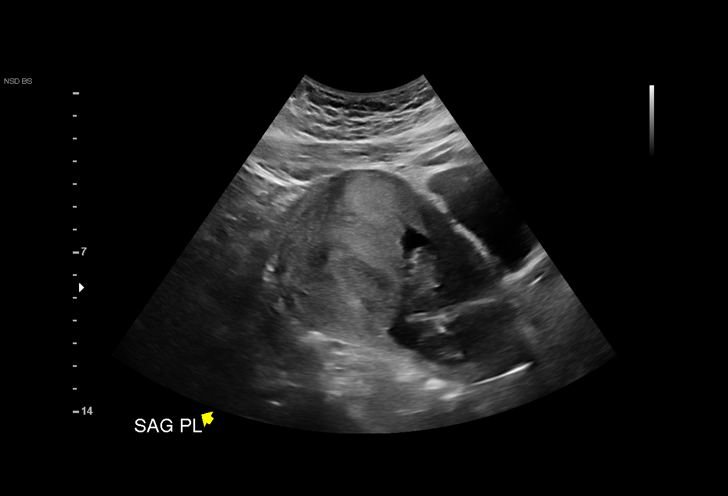
[im 10/14]
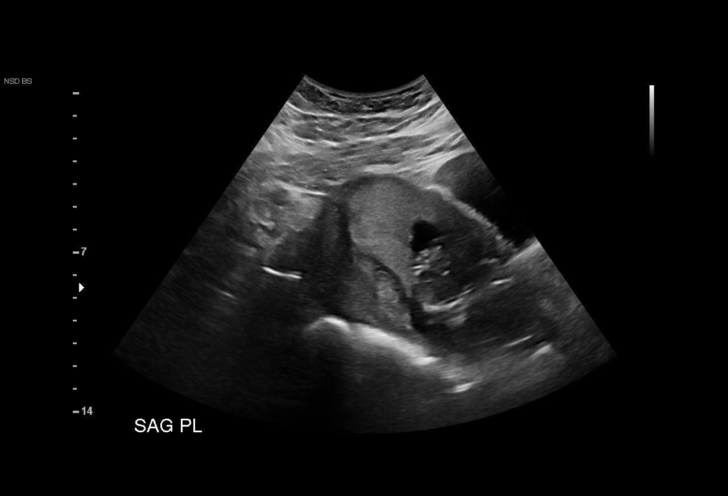
[im 11/14]
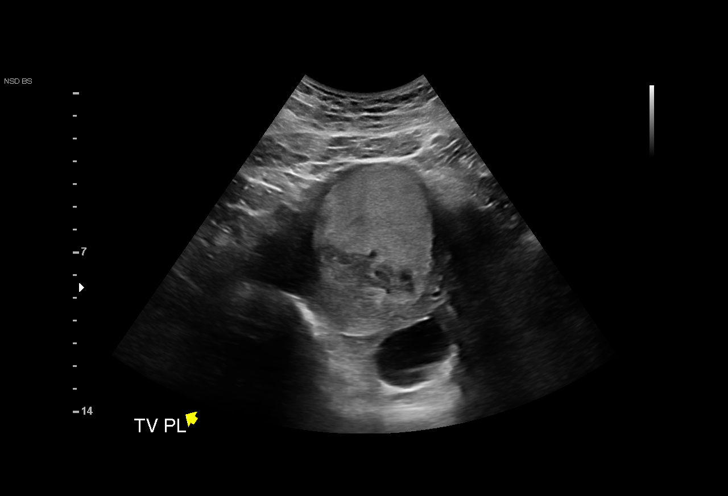
[im 12/14]
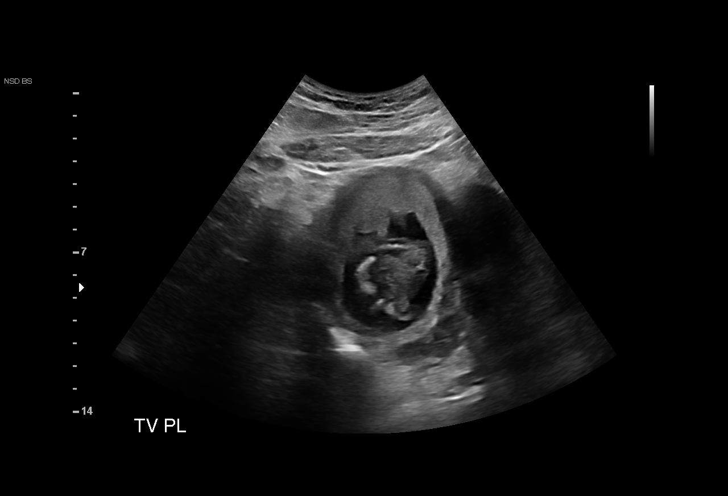
[im 13/14]
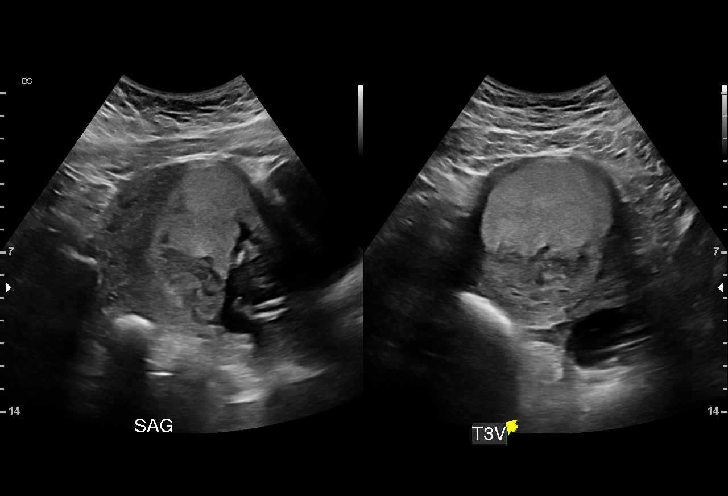
[im 14/14]
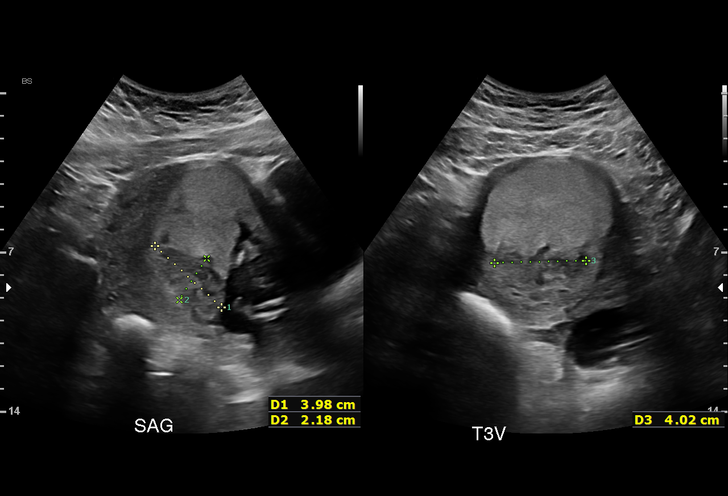

[14 of 14 positions shown; findings below may reference images not displayed]

[facility] NP                                  [HOSPITAL]

 1  US MFM OB LIMITED                     76815.01    DIPALI TORO

Indications

 Vaginal bleeding in pregnancy, second
 trimester
 15 weeks gestation of pregnancy
 Pelvic pain affecting pregnancy in second
 trimester
 Subchorionic hemorrhage, antepartum
Fetal Evaluation

 Num Of Fetuses:          1
 Fetal Heart Rate(bpm):   176
 Cardiac Activity:        Observed
 Presentation:            Cephalic
 Placenta:                Anterior
 P. Cord Insertion:       Not well visualized

 Amniotic Fluid
 AFI FV:      Subjectively decreased

                             Largest Pocket(cm)


 Comment:    Subchorionic hemorrhage noted to measure 4 x 2.2 x 4cm.
OB History

 Gravidity:    1         Term:   0        Prem:   0        SAB:   0
 TOP:          0       Ectopic:  0        Living: 0
Gestational Age
 Best:          15w 1d     Det. By:  Early Ultrasound         EDD:   12/13/20
Cervix Uterus Adnexa

 Cervix
 Funneling of internal os noted.

 Right Ovary
 Previously seen

 Left Ovary
 Previously seen.
Impression

 Limited exam to assess vaginal bleeding in the second
 trimester
 No gross anomalies were observed however, a detailed exam
 is recommended to for a comprehensive assessment at 18-
 20 weeks gestation.
 There is good fetal movement, however, amniotic fluid
 appears subjectively decreased

 There is no evidence of placental previa or placenta previa
Recommendations

 Detailed exam at 18-20 weeks.
